# Patient Record
Sex: Male | Born: 1960 | Race: White | Hispanic: No | Marital: Single | Smoking: Never smoker
Health system: Southern US, Community
[De-identification: ages and names within clinical notes are randomized; demographics above are authoritative.]

## PROBLEM LIST (undated history)

## (undated) DIAGNOSIS — K219 Gastro-esophageal reflux disease without esophagitis: Secondary | ICD-10-CM

## (undated) HISTORY — PX: APPENDECTOMY: SHX54

---

## 2016-03-13 ENCOUNTER — Encounter: Payer: Self-pay | Admitting: *Deleted

## 2016-03-16 ENCOUNTER — Encounter: Payer: Self-pay | Admitting: Anesthesiology

## 2016-03-16 ENCOUNTER — Ambulatory Visit: Payer: BLUE CROSS/BLUE SHIELD | Admitting: Anesthesiology

## 2016-03-16 ENCOUNTER — Ambulatory Visit
Admission: RE | Admit: 2016-03-16 | Discharge: 2016-03-16 | Disposition: A | Discharge status: Home / Self Care | Payer: BLUE CROSS/BLUE SHIELD | Source: Ambulatory Visit | Attending: Gastroenterology | Admitting: Gastroenterology

## 2016-03-16 ENCOUNTER — Encounter
Admission: RE | Disposition: A | Discharge status: Home / Self Care | Payer: Self-pay | Source: Ambulatory Visit | Attending: Gastroenterology

## 2016-03-16 DIAGNOSIS — R1310 Dysphagia, unspecified: Secondary | ICD-10-CM | POA: Diagnosis not present

## 2016-03-16 DIAGNOSIS — Z1211 Encounter for screening for malignant neoplasm of colon: Secondary | ICD-10-CM | POA: Diagnosis not present

## 2016-03-16 DIAGNOSIS — A630 Anogenital (venereal) warts: Secondary | ICD-10-CM | POA: Insufficient documentation

## 2016-03-16 DIAGNOSIS — K219 Gastro-esophageal reflux disease without esophagitis: Secondary | ICD-10-CM | POA: Diagnosis not present

## 2016-03-16 HISTORY — PX: COLONOSCOPY WITH PROPOFOL: SHX5780

## 2016-03-16 HISTORY — PX: ESOPHAGOGASTRODUODENOSCOPY (EGD) WITH PROPOFOL: SHX5813

## 2016-03-16 HISTORY — DX: Gastro-esophageal reflux disease without esophagitis: K21.9

## 2016-03-16 SURGERY — COLONOSCOPY WITH PROPOFOL
Anesthesia: General

## 2016-03-16 MED ORDER — GLYCOPYRROLATE 0.2 MG/ML IJ SOLN
INTRAMUSCULAR | Status: DC | PRN
  Administered 2016-03-16: 0.2 mg via INTRAVENOUS

## 2016-03-16 MED ORDER — SODIUM CHLORIDE 0.9 % IV SOLN: INTRAVENOUS | Status: DC

## 2016-03-16 MED ORDER — PROPOFOL 500 MG/50ML IV EMUL
INTRAVENOUS | Status: DC | PRN
  Administered 2016-03-16: 200 ug/kg/min via INTRAVENOUS

## 2016-03-16 MED ORDER — LIDOCAINE HCL (CARDIAC) 20 MG/ML IV SOLN
INTRAVENOUS | Status: DC | PRN
  Administered 2016-03-16: 100 mg via INTRAVENOUS

## 2016-03-16 MED ORDER — SODIUM CHLORIDE 0.9 % IV SOLN
INTRAVENOUS | Status: DC
  Administered 2016-03-16: 1000 mL via INTRAVENOUS

## 2016-03-16 MED ORDER — PROPOFOL 10 MG/ML IV BOLUS
INTRAVENOUS | Status: DC | PRN
  Administered 2016-03-16: 70 mg via INTRAVENOUS

## 2016-03-16 NOTE — Transfer of Care (Signed)
Immediate Anesthesia Transfer of Care Note  Patient: Donald HeathJohn Strickland  Procedure(s) Performed: Procedure(s): COLONOSCOPY WITH PROPOFOL (N/A) ESOPHAGOGASTRODUODENOSCOPY (EGD) WITH PROPOFOL (N/A)  Patient Location: Endoscopy Unit  Anesthesia Type:General  Level of Consciousness: sedated  Airway & Oxygen Therapy: Patient connected to nasal cannula oxygen  Post-op Assessment: Post -op Vital signs reviewed and stable  Post vital signs: stable  Last Vitals:  Filed Vitals:   03/16/16 1248 03/16/16 1343  BP: 130/82 105/61  Pulse: 82 63  Temp: 37.4 C 36.2 C  Resp: 16 14    Last Pain: There were no vitals filed for this visit.       Complications: No apparent anesthesia complications

## 2016-03-16 NOTE — Op Note (Signed)
[facility] Gastroenterology Patient Name: Donald Strickland Procedure Date: 03/16/2016 12:16 PM MRN: [id number] Account #: [id number] Date of Birth: 04-Jun-1961 Admit Type: Outpatient Age: 55 Room: [facility] ENDO ROOM 4 Gender: Male Note Status: Finalized Procedure:            Upper GI endoscopy Indications:          Dysphagia Providers:            Ezzard Standing. Bluford Kaufmann, MD Referring MD:         Pollyann Samples. Mancheno Revelo (Referring MD) Medicines:            Monitored Anesthesia Care Complications:        No immediate complications. Procedure:            Pre-Anesthesia Assessment:                       - Prior to the procedure, a History and Physical was                        performed, and patient medications, allergies and                        sensitivities were reviewed. The patient's tolerance of                        previous anesthesia was reviewed.                       - The risks and benefits of the procedure and the                        sedation options and risks were discussed with the                        patient. All questions were answered and informed                        consent was obtained.                       - After reviewing the risks and benefits, the patient                        was deemed in satisfactory condition to undergo the                        procedure.                       After obtaining informed consent, the endoscope was                        passed under direct vision. Throughout the procedure,                        the patient's blood pressure, pulse, and oxygen                        saturations were monitored continuously. The Endoscope  was introduced through the mouth, and advanced to the                        second part of duodenum. The upper GI endoscopy was                        accomplished without difficulty. The patient tolerated                        the procedure well. Findings:      No  endoscopic abnormality was evident in the esophagus to explain the       patient's complaint of dysphagia. It was decided, however, to proceed       with dilation of the entire esophagus. The scope was withdrawn. Dilation       was performed with a Maloney dilator with mild resistance at 54 Fr.      The entire examined stomach was normal.      The examined duodenum was normal. Impression:           - No endoscopic esophageal abnormality to explain                        patient's dysphagia. Esophagus dilated. Dilated.                       - Normal stomach.                       - Normal examined duodenum.                       - No specimens collected. Recommendation:       - Discharge patient to home.                       - Observe patient's clinical course. Procedure Code(s):    --- Professional ---                       [phone], Esophagogastroduodenoscopy, flexible, transoral;                        diagnostic, including collection of specimen(s) by                        brushing or washing, when performed (separate procedure)                       43450, Dilation of esophagus, by unguided sound or                        bougie, single or multiple passes Diagnosis Code(s):    --- Professional ---                       R13.10, Dysphagia, unspecified CPT copyright 2016 American Medical Association. All rights reserved. The codes documented in this report are preliminary and upon coder review may  be revised to meet current compliance requirements. Wallace CullensPaul Y Daylin Eads, MD 03/16/2016 1:21:57 PM This report has been signed electronically. Number of Addenda: 0 Note Initiated On: 03/16/2016 12:16 PM      [facility]

## 2016-03-16 NOTE — Anesthesia Postprocedure Evaluation (Signed)
Anesthesia Post Note  Patient: Wardell HeathJohn Katayama  Procedure(s) Performed: Procedure(s) (LRB): COLONOSCOPY WITH PROPOFOL (N/A) ESOPHAGOGASTRODUODENOSCOPY (EGD) WITH PROPOFOL (N/A)  Patient location during evaluation: Endoscopy Anesthesia Type: General Level of consciousness: awake and alert Pain management: pain level controlled Vital Signs Assessment: post-procedure vital signs reviewed and stable Respiratory status: spontaneous breathing, nonlabored ventilation, respiratory function stable and patient connected to nasal cannula oxygen Cardiovascular status: blood pressure returned to baseline and stable Postop Assessment: no signs of nausea or vomiting Anesthetic complications: no    Last Vitals:  Filed Vitals:   03/16/16 1410 03/16/16 1420  BP: 125/69 100/84  Pulse: 74 70  Temp:    Resp: 16 13    Last Pain: There were no vitals filed for this visit.               Cleda MccreedyJoseph K Cove Haydon

## 2016-03-16 NOTE — Anesthesia Preprocedure Evaluation (Signed)
Anesthesia Evaluation  Patient identified by MRN, date of birth, ID band Patient awake    Reviewed: Allergy & Precautions, H&P , NPO status , Patient's Chart, lab work & pertinent test results  History of Anesthesia Complications Negative for: history of anesthetic complications  Airway Mallampati: II  TM Distance: >3 FB Neck ROM: full    Dental  (+) Teeth Intact   Pulmonary neg pulmonary ROS, neg shortness of breath,    Pulmonary exam normal breath sounds clear to auscultation       Cardiovascular Exercise Tolerance: Good (-) angina(-) Past MI and (-) DOE negative cardio ROS Normal cardiovascular exam Rhythm:regular Rate:Normal     Neuro/Psych negative neurological ROS  negative psych ROS   GI/Hepatic Neg liver ROS, GERD  Controlled,  Endo/Other  negative endocrine ROS  Renal/GU negative Renal ROS  negative genitourinary   Musculoskeletal   Abdominal   Peds  Hematology negative hematology ROS (+)   Anesthesia Other Findings Past Medical History:   GERD (gastroesophageal reflux disease)                      Past Surgical History:   APPENDECTOMY                                                 BMI    Body Mass Index   25.99 kg/m 2    Signs and symptoms suggestive of sleep apnea    Reproductive/Obstetrics negative OB ROS                             Anesthesia Physical Anesthesia Plan  ASA: III  Anesthesia Plan: General   Post-op Pain Management:    Induction:   Airway Management Planned:   Additional Equipment:   Intra-op Plan:   Post-operative Plan:   Informed Consent: I have reviewed the patients History and Physical, chart, labs and discussed the procedure including the risks, benefits and alternatives for the proposed anesthesia with the patient or authorized representative who has indicated his/her understanding and acceptance.   Dental Advisory Given  Plan  Discussed with: Anesthesiologist, CRNA and Surgeon  Anesthesia Plan Comments:         Anesthesia Quick Evaluation

## 2016-03-16 NOTE — H&P (Signed)
    Primary Care Physician:  Presley RaddleAdrian Mancheno, MD Primary Gastroenterologist:  Dr. Bluford Kaufmannh  Pre-Procedure History & Physical: HPI:  Donald Strickland is a 55 y.o. male is here for an EGD/colonoscopy.   Past Medical History  Diagnosis Date  . GERD (gastroesophageal reflux disease)     Past Surgical History  Procedure Laterality Date  . Appendectomy      Prior to Admission medications   Medication Sig Start Date End Date Taking? Authorizing Provider  emtricitabine-tenofovir (TRUVADA) 200-300 MG tablet Take 1 tablet by mouth daily.   Yes Historical Provider, MD  sildenafil (VIAGRA) 50 MG tablet Take 50 mg by mouth daily as needed for erectile dysfunction.   Yes Historical Provider, MD    Allergies as of 03/05/2016  . (Not on File)    History reviewed. No pertinent family history.  Social History   Social History  . Marital Status: Single    Spouse Name: N/A  . Number of Children: N/A  . Years of Education: N/A   Occupational History  . Not on file.   Social History Main Topics  . Smoking status: Never Smoker   . Smokeless tobacco: Never Used  . Alcohol Use: Not on file  . Drug Use: Not on file  . Sexual Activity: Not on file   Other Topics Concern  . Not on file   Social History Narrative    Review of Systems: See HPI, otherwise negative ROS  Physical Exam: BP 130/82 mmHg  Pulse 82  Temp(Src) 99.4 F (37.4 C) (Tympanic)  Resp 16  Ht 5' 11.5" (1.816 m)  Wt 189 lb (85.73 kg)  BMI 26.00 kg/m2  SpO2 100% General:   Alert,  pleasant and cooperative in NAD Head:  Normocephalic and atraumatic. Neck:  Supple; no masses or thyromegaly. Lungs:  Clear throughout to auscultation.    Heart:  Regular rate and rhythm. Abdomen:  Soft, nontender and nondistended. Normal bowel sounds, without guarding, and without rebound.   Neurologic:  Alert and  oriented x4;  grossly normal neurologically.  Impression/Plan: Donald Strickland is here for an EGD/colonoscopy to be performed for  dysphagia, screening.  Risks, benefits, limitations, and alternatives regarding  EGD/colonoscopy have been reviewed with the patient.  Questions have been answered.  All parties agreeable.   Emileo Semel, Ezzard StandingPAUL Y, MD  03/16/2016, 12:51 PM

## 2016-03-16 NOTE — Op Note (Signed)
[facility] Gastroenterology Patient Name: Donald Strickland Procedure Date: 03/16/2016 12:16 PM MRN: [id number] Account #: [id number] Date of Birth: [birth year] Admit Type: Outpatient Age: [age] [facility] ROOM 4 Gender: Male Note Status: Finalized Procedure:            Colonoscopy Indications:          Screening for colorectal malignant neoplasm Providers:            Ezzard StandingPaul Y. Bluford Kaufmannh, MD Referring MD:         Pollyann SamplesAdrian A. Mancheno Revelo (Referring MD) Medicines:            Monitored Anesthesia Care Complications:        No immediate complications. Procedure:            Pre-Anesthesia Assessment:                       - Prior to the procedure, a History and Physical was                        performed, and patient medications, allergies and                        sensitivities were reviewed. The patient's tolerance of                        previous anesthesia was reviewed.                       - The risks and benefits of the procedure and the                        sedation options and risks were discussed with the                        patient. All questions were answered and informed                        consent was obtained.                       - After reviewing the risks and benefits, the patient                        was deemed in satisfactory condition to undergo the                        procedure.                       After obtaining informed consent, the colonoscope was                        passed under direct vision. Throughout the procedure,                        the patient's blood pressure, pulse, and oxygen                        saturations were monitored continuously. The  Colonoscope was introduced through the anus and                        advanced to the the cecum, identified by appendiceal                        orifice and ileocecal valve. The colonoscopy was                        performed without difficulty. The  patient tolerated the                        procedure well. The quality of the bowel preparation                        was good. Findings:      The colon (entire examined portion) appeared normal. Small anal lesion.       Biopsy taken Impression:           - The entire examined colon is normal.                       - No specimens collected. Recommendation:       - Discharge patient to home.                       - Await pathology results.                       - Repeat colonoscopy in 10 years for surveillance.                       - The findings and recommendations were discussed with                        the patient. Procedure Code(s):    --- Professional ---                       [phone], Colonoscopy, flexible; diagnostic, including                        collection of specimen(s) by brushing or washing, when                        performed (separate procedure) Diagnosis Code(s):    --- Professional ---                       Z12.11, Encounter for screening for malignant neoplasm                        of colon CPT copyright 2016 American Medical Association. All rights reserved. The codes documented in this report are preliminary and upon coder review may  be revised to meet current compliance requirements. Wallace Cullens, MD 03/16/2016 1:38:01 PM This report has been signed electronically. Number of Addenda: 0 Note Initiated On: 03/16/2016 12:16 PM Scope Withdrawal Time: 0 hours 5 minutes 24 seconds  Total Procedure Duration: 0 hours 9 minutes 2 seconds       [facility]

## 2016-03-17 ENCOUNTER — Encounter: Payer: Self-pay | Admitting: Gastroenterology

## 2016-03-17 LAB — SURGICAL PATHOLOGY

## 2016-04-10 ENCOUNTER — Encounter: Payer: Self-pay | Admitting: *Deleted

## 2016-04-22 ENCOUNTER — Ambulatory Visit: Payer: Self-pay | Admitting: General Surgery

## 2016-06-02 ENCOUNTER — Ambulatory Visit: Payer: BLUE CROSS/BLUE SHIELD | Admitting: General Surgery

## 2016-07-02 ENCOUNTER — Encounter: Payer: Self-pay | Admitting: *Deleted

## 2016-07-08 ENCOUNTER — Encounter: Payer: Self-pay | Admitting: General Surgery

## 2016-07-08 ENCOUNTER — Ambulatory Visit (INDEPENDENT_AMBULATORY_CARE_PROVIDER_SITE_OTHER): Payer: BLUE CROSS/BLUE SHIELD | Admitting: General Surgery

## 2016-07-08 VITALS — BP 122/78 | HR 72 | Resp 12 | Ht 70.0 in | Wt 186.0 lb

## 2016-07-08 DIAGNOSIS — A630 Anogenital (venereal) warts: Secondary | ICD-10-CM

## 2016-07-08 NOTE — Progress Notes (Signed)
Patient ID: Donald Strickland, male   DOB: 01/06/1961, 55 y.o.   MRN: [id number]  Chief Complaint  Patient presents with  . Other    anal lesion     HPI Donald Strickland is a 55 y.o. male here today fro a evaluation of a anal lesion . Patient had a  colonoscopy on 03/16/16. No Gi problems at this time. He states he is having some trouble swallowing. Water goes down fine, he states this has been going on for about a year. The patient underwent upper endoscopy with dilatation in May with some mild improvement in his symptoms. Incidental finding noted during colonoscopy was a small nodular area biopsy which showed a condyloma. HPI  Past Medical History:  Diagnosis Date  . GERD (gastroesophageal reflux disease)     Past Surgical History:  Procedure Laterality Date  . APPENDECTOMY    . COLONOSCOPY WITH PROPOFOL N/A 03/16/2016   Procedure: COLONOSCOPY WITH PROPOFOL;  Surgeon: Wallace Cullens, MD;  Location: [facility] ENDOSCOPY;  Service: Gastroenterology;  Laterality: N/A;  . ESOPHAGOGASTRODUODENOSCOPY (EGD) WITH PROPOFOL N/A 03/16/2016   Procedure: ESOPHAGOGASTRODUODENOSCOPY (EGD) WITH PROPOFOL;  Surgeon: Wallace Cullens, MD;  Location: [facility] ENDOSCOPY;  Service: Gastroenterology;  Laterality: N/A;    History reviewed. No pertinent family history.  Social History Social History  Substance Use Topics  . Smoking status: Current Every Day Smoker  . Smokeless tobacco: Never Used  . Alcohol use Yes    Allergies  Allergen Reactions  . Tetracyclines & Related     Current Outpatient Prescriptions  Medication Sig Dispense Refill  . emtricitabine-tenofovir (TRUVADA) 200-300 MG tablet Take 1 tablet by mouth daily.    . sildenafil (VIAGRA) 50 MG tablet Take 50 mg by mouth daily as needed for erectile dysfunction.     No current facility-administered medications for this visit.     Review of Systems Review of Systems  Constitutional: Negative.   Respiratory: Negative.   Cardiovascular: Negative.    Gastrointestinal: Negative.     Blood pressure 122/78, pulse 72, resp. rate 12, height 5\' 10"  (1.778 m), weight 186 lb (84.4 kg).  Physical Exam Physical Exam  Constitutional: He is oriented to person, place, and time. He appears well-developed and well-nourished.  Eyes: Conjunctivae are normal. No scleral icterus.  Neck: Neck supple.  Cardiovascular: Normal rate, regular rhythm and normal heart sounds.   Pulmonary/Chest: Effort normal and breath sounds normal.  Abdominal: Soft. Normal appearance and bowel sounds are normal. There is no hepatomegaly. There is no tenderness.  Genitourinary: Prostate normal. Rectal exam shows fissure.     Lymphadenopathy:    He has no cervical adenopathy.  Neurological: He is alert and oriented to person, place, and time.  Skin: Skin is warm and dry.    Data Reviewed Endoscopy reports, pathology report.  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   Assessment    Condyloma with low-grade dysplasia. No visible lesion on exam.    Plan    Patient participates in anal receptive intercourse. Ongoing trauma puts him at risk for recurrent and progressive disease. Barrier protection recommended.  Patient reports his HIV status is negative. This would certainly be important  Recommendation from infectious disease was for evaluation at [facility] regarding possible ration versus other avenues to minimize the risk of virus transmission. This information has been forwarded to the patient in my chart.      Follow  up in one year with Anoscopy in office.   This information has been scribed by Donald Strickland.    Earline MayotteByrnett, Shanta Hartner W 07/10/2016, 11:02 AM

## 2016-07-09 ENCOUNTER — Encounter: Payer: Self-pay | Admitting: General Surgery

## 2016-07-21 ENCOUNTER — Telehealth: Payer: Self-pay | Admitting: *Deleted

## 2016-07-21 NOTE — Telephone Encounter (Signed)
-----   Message from Earline MayotteJeffrey W Byrnett, MD sent at 07/21/2016  4:27 PM EDT ----- Patient ws sent an email re: ID f/u re: [email or web address].  Confirm he received. Thanks.

## 2016-07-22 NOTE — Telephone Encounter (Signed)
Patient called back to confirm he received e-mail. He states if he has any more questions he will call back.

## 2016-07-22 NOTE — Telephone Encounter (Signed)
Patient called back and he is going to check his MyChart message now

## 2017-07-01 ENCOUNTER — Ambulatory Visit (INDEPENDENT_AMBULATORY_CARE_PROVIDER_SITE_OTHER): Payer: BLUE CROSS/BLUE SHIELD | Admitting: General Surgery

## 2017-07-01 ENCOUNTER — Encounter: Payer: Self-pay | Admitting: General Surgery

## 2017-07-01 VITALS — BP 142/82 | HR 100 | Resp 12 | Ht 71.0 in | Wt 186.0 lb

## 2017-07-01 DIAGNOSIS — A630 Anogenital (venereal) warts: Secondary | ICD-10-CM | POA: Diagnosis not present

## 2017-07-01 DIAGNOSIS — R1314 Dysphagia, pharyngoesophageal phase: Secondary | ICD-10-CM | POA: Diagnosis not present

## 2017-07-01 NOTE — Patient Instructions (Addendum)
The patient is aware to call back for any questions or concerns.  The patient is scheduled for a Barium swallow at [facility] on 07/07/17 at 8:00 am. He is to arrive by 7:45 am and have nothing to eat or drink after midnight. The patient is aware of date, time, and instructions.

## 2017-07-01 NOTE — Progress Notes (Signed)
Patient ID: Donald Strickland, male   DOB: 1961-06-15, 56 y.o.   MRN: [id number]  Chief Complaint  Patient presents with  . Follow-up    anoscopy    HPI Donald Strickland is a 56 y.o. male.  Here for follow up. Denies any diarrhea or constipation. Bowels move daily. Last colonoscopy was 2017. He feels medications stick in upper throat. He was having this issue last year when he had the endoscopy. He has even tried putting pills in food to take and it didn't help. during colonoscopy was a small nodular area biopsy which showed a condyloma.   HPI  Past Medical History:  Diagnosis Date  . GERD (gastroesophageal reflux disease)     Past Surgical History:  Procedure Laterality Date  . APPENDECTOMY    . COLONOSCOPY WITH PROPOFOL N/A 03/16/2016   Procedure: COLONOSCOPY WITH PROPOFOL;  Surgeon: Wallace Cullens, MD;  Location: [facility] ENDOSCOPY;  Service: Gastroenterology;  Laterality: N/A;  . ESOPHAGOGASTRODUODENOSCOPY (EGD) WITH PROPOFOL N/A 03/16/2016   Procedure: ESOPHAGOGASTRODUODENOSCOPY (EGD) WITH PROPOFOL;  Surgeon: Wallace Cullens, MD;  Location: [facility] ENDOSCOPY;  Service: Gastroenterology;  Laterality: N/A;    History reviewed. No pertinent family history.  Social History Social History  Substance Use Topics  . Smoking status: Never Smoker  . Smokeless tobacco: Never Used  . Alcohol use Yes    Allergies  Allergen Reactions  . Tetracyclines & Related     Current Outpatient Prescriptions  Medication Sig Dispense Refill  . emtricitabine-tenofovir (TRUVADA) 200-300 MG tablet Take 1 tablet by mouth daily.    . sildenafil (VIAGRA) 50 MG tablet Take 50 mg by mouth daily as needed for erectile dysfunction.     No current facility-administered medications for this visit.     Review of Systems Review of Systems  Constitutional: Negative.   Respiratory: Negative.   Cardiovascular: Negative.   Gastrointestinal: Negative for blood in stool, constipation and diarrhea.    Blood pressure (!) 142/82,  pulse 100, resp. rate 12, height 5\' 11"  (1.803 m), weight 186 lb (84.4 kg).  Physical Exam Physical Exam  Constitutional: He is oriented to person, place, and time. He appears well-developed and well-nourished.  HENT:  Mouth/Throat: Oropharynx is clear and moist.  Eyes: Conjunctivae are normal. No scleral icterus.  Neck: Neck supple.  Cardiovascular: Normal rate, regular rhythm and normal heart sounds.   Pulmonary/Chest: Effort normal and breath sounds normal.  Abdominal: Soft. There is no tenderness.  Lymphadenopathy:    He has no cervical adenopathy.  Neurological: He is alert and oriented to person, place, and time.  Skin: Skin is warm and dry.  Psychiatric: His behavior is normal.    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   Anoscopy was completed. No visible abnormality of the lower rectal mucosa, and a rectal junction or perianal tissue.  Assessment    Past history condyloma with low-grade dysplasia in a patient with high-risk sexual habits.    Plan    Referral information regarding formal infectious disease consultation awake forced had previously been sent to the patient. At this time he is deciding whether to act on this or not.  Bartholomew Boards, M.D.  Assistant Professor, Infectious Disease  [facility]  Certified, Infectious Diseases, American Board of Internal Medicine  Department of Internal Medicine  [address] [place], [place] [address]  Phone: [phone]  Fax: [phone]       The upper endoscopy report from 03/16/2016 showed no evidence of a source for his reported dysphasia and the patient reported transient improvement after bougie dilatation. He reports no difficulty with eating bread or meat, mainly difficulty with pills. Someone had suggested the possibility that he could have a  ledge or diverticulum making pill swallowing difficult (which is a new problem over the last 2 years). It's possible he has a Zenker's diverticulum, but he is not certainly have the typical age group for this problem. He may have some other esophageal motility issue and this can be best assessed by a detailed swallow study.  Unless he has symptoms related to the anus a annual exam and anoscopy is been recommended.     Barium swallow Follow up in one year with office anoscopy.  HPI, Physical Exam, Assessment and Plan have been scribed under the direction and in the presence of Earline Mayotte, MD. Dorathy Daft, RN  I have completed the exam and reviewed the above documentation for accuracy and completeness.  I agree with the above.  Museum/gallery conservator has been used and any errors in dictation or transcription are unintentional.  Donnalee Curry, M.D., F.A.C.S.  The patient is scheduled for a Barium swallow at [facility] on 07/07/17 at 8:00 am. He is to arrive by 7:45 am and have nothing to eat or drink after midnight. The patient is aware of date, time, and instructions.  Documented by Sinda Du LPN  Earline Mayotte 07/02/2017, 7:49 AM

## 2017-07-02 DIAGNOSIS — R1314 Dysphagia, pharyngoesophageal phase: Secondary | ICD-10-CM | POA: Insufficient documentation

## 2017-07-07 ENCOUNTER — Ambulatory Visit
Admission: RE | Admit: 2017-07-07 | Discharge: 2017-07-07 | Disposition: A | Discharge status: Home / Self Care | Payer: BLUE CROSS/BLUE SHIELD | Source: Ambulatory Visit | Attending: General Surgery | Admitting: General Surgery

## 2017-07-07 DIAGNOSIS — R1314 Dysphagia, pharyngoesophageal phase: Secondary | ICD-10-CM | POA: Insufficient documentation

## 2017-07-08 ENCOUNTER — Telehealth: Payer: Self-pay

## 2017-07-08 NOTE — Telephone Encounter (Signed)
Unable to leave message to call back for results.

## 2017-07-08 NOTE — Telephone Encounter (Signed)
-----   Message from Jeffrey W Byrnett, MD sent at 07/07/2017  7:21 PM EDT ----- Patient sent a message in MyChart: Normal study. Confirm he was able to retrieve the message. Thanks.  ----- Message ----- From: Interface, Rad Results In Sent: 07/07/2017   8:47 AM To: Jeffrey W Byrnett, MD    

## 2017-07-09 NOTE — Telephone Encounter (Signed)
-----   Message from Earline MayotteJeffrey W Byrnett, MD sent at 07/07/2017  7:21 PM EDT ----- Patient sent a message in MyChart: Normal study. Confirm he was able to retrieve the message. Thanks.  ----- Message ----- From: Interface, Rad Results In Sent: 07/07/2017   8:47 AM To: Earline MayotteJeffrey W Byrnett, MD

## 2017-07-09 NOTE — Telephone Encounter (Signed)
Patient aware of results and had no questions.

## 2018-05-25 ENCOUNTER — Encounter: Payer: Self-pay | Admitting: *Deleted

## 2018-07-05 ENCOUNTER — Ambulatory Visit: Payer: BLUE CROSS/BLUE SHIELD | Admitting: General Surgery

## 2018-08-11 ENCOUNTER — Ambulatory Visit: Payer: BLUE CROSS/BLUE SHIELD | Admitting: General Surgery

## 2018-10-05 ENCOUNTER — Encounter: Payer: Self-pay | Admitting: *Deleted

## 2020-02-14 ENCOUNTER — Ambulatory Visit
Admission: EM | Admit: 2020-02-14 | Discharge: 2020-02-14 | Disposition: A | Discharge status: Home / Self Care | Payer: BLUE CROSS/BLUE SHIELD | Attending: Family Medicine | Admitting: Family Medicine

## 2020-02-14 ENCOUNTER — Encounter: Payer: Self-pay | Admitting: Emergency Medicine

## 2020-02-14 ENCOUNTER — Other Ambulatory Visit: Payer: Self-pay

## 2020-02-14 ENCOUNTER — Ambulatory Visit (INDEPENDENT_AMBULATORY_CARE_PROVIDER_SITE_OTHER): Payer: BLUE CROSS/BLUE SHIELD

## 2020-02-14 DIAGNOSIS — M25552 Pain in left hip: Secondary | ICD-10-CM

## 2020-02-14 DIAGNOSIS — W19XXXA Unspecified fall, initial encounter: Secondary | ICD-10-CM

## 2020-02-14 NOTE — ED Triage Notes (Signed)
Patient states he fell yesterday on his left side. He states this morning he woke up and his left hip is hurting and hurts more when he lays down.

## 2020-02-14 NOTE — ED Provider Notes (Signed)
[facility]    CSN: [id number] Arrival date & time: 02/14/20  1330  History   Chief Complaint Chief Complaint  Patient presents with  . Fall  . Hip Pain    left   HPI 59 year old male presents with the above complaints.  Patient states that he was hiking yesterday.  He ended up falling and landing on his left hip.  Patient reports that he seemed to do fine after the fall.  However, last night he developed left hip pain.  He reports lateral and medial thigh pain as well as groin pain.  He is able to ambulate.  He states that his pain is currently 1/10 in severity.  Described as an aching sensation.  No other reported symptoms.  No other complaints   Past Medical History:  Diagnosis Date  . GERD (gastroesophageal reflux disease)    Patient Active Problem List   Diagnosis Date Noted  . Pharyngoesophageal dysphagia 07/02/2017  . Condyloma acuminatum in male 03/16/2016   Past Surgical History:  Procedure Laterality Date  . APPENDECTOMY    . COLONOSCOPY WITH PROPOFOL N/A 03/16/2016   Procedure: COLONOSCOPY WITH PROPOFOL;  Surgeon: Wallace Cullens, MD;  Location: [facility] ENDOSCOPY;  Service: Gastroenterology;  Laterality: N/A;  . ESOPHAGOGASTRODUODENOSCOPY (EGD) WITH PROPOFOL N/A 03/16/2016   Procedure: ESOPHAGOGASTRODUODENOSCOPY (EGD) WITH PROPOFOL;  Surgeon: Wallace Cullens, MD;  Location: [facility] ENDOSCOPY;  Service: Gastroenterology;  Laterality: N/A;    Home Medications    Prior to Admission medications   Medication Sig Start Date End Date Taking? Authorizing Provider  emtricitabine-tenofovir (TRUVADA) 200-300 MG tablet Take 1 tablet by mouth daily.   Yes [provider]  sildenafil (VIAGRA) 50 MG tablet Take 50 mg by mouth daily as needed for erectile dysfunction.    [provider]   Social History Social History   Tobacco Use  . Smoking status: Never Smoker  . Smokeless tobacco: Never Used  Substance Use Topics  . Alcohol use: Yes  . Drug use: No      Allergies   Tetracyclines & related   Review of Systems Review of Systems  Musculoskeletal:       Left hip pain   Physical Exam Triage Vital Signs ED Triage Vitals  Enc Vitals Group     BP 02/14/20 1348 (!) 136/95     Pulse Rate 02/14/20 1348 87     Resp 02/14/20 1348 18     Temp 02/14/20 1348 98 F (36.7 C)     Temp Source 02/14/20 1348 Oral     SpO2 02/14/20 1348 98 %     Weight 02/14/20 1347 202 lb (91.6 kg)     Height 02/14/20 1347 5\' 11"  (1.803 m)     Head Circumference --      Peak Flow --      Pain Score 02/14/20 1347 1     Pain Loc --      Pain Edu? --      Excl. in GC? --    Updated Vital Signs BP (!) 136/95 (BP Location: Left Arm)   Pulse 87   Temp 98 F (36.7 C) (Oral)   Resp 18   Ht 5\' 11"  (1.803 m)   Wt 91.6 kg   SpO2 98%   BMI 28.17 kg/m   Visual Acuity Right Eye Distance:   Left Eye Distance:   Bilateral Distance:    Right Eye Near:   Left Eye Near:    Bilateral Near:  Physical Exam Vitals and nursing note reviewed.  Constitutional:      General: He is not in acute distress.    Appearance: Normal appearance. He is not ill-appearing.  HENT:     Head: Normocephalic and atraumatic.  Eyes:     General:        Right eye: No discharge.        Left eye: No discharge.     Conjunctiva/sclera: Conjunctivae normal.  Cardiovascular:     Rate and Rhythm: Normal rate and regular rhythm.     Heart sounds: No murmur.  Pulmonary:     Effort: Pulmonary effort is normal. No respiratory distress.  Musculoskeletal:     Comments: Left hip -good range of motion of the hip.  Mild pain with FADIR. No bruising noted. No greater trochanter tenderness.   Neurological:     Mental Status: He is alert.  Psychiatric:        Mood and Affect: Mood normal.        Behavior: Behavior normal.    UC Treatments / Results  Labs (all labs ordered are listed, but only abnormal results are displayed) Labs Reviewed - No data to  display  EKG   Radiology DG Hip Unilat W or Wo Pelvis 2-3 Views Left  Result Date: 02/14/2020 CLINICAL DATA:  Left hip pain. The patient fell climbing out of a creek yesterday. Initial encounter. EXAM: DG HIP (WITH OR WITHOUT PELVIS) 2-3V LEFT COMPARISON:  None. FINDINGS: There is no evidence of hip fracture or dislocation. There is no evidence of arthropathy or other focal bone abnormality. IMPRESSION: Normal exam. Electronically Signed   By: Inge Rise M.D.   On: 02/14/2020 14:33    Procedures Procedures (including critical care time)  Medications Ordered in UC Medications - No data to display  Initial Impression / Assessment and Plan / UC Course  I have reviewed the triage vital signs and the nursing notes.  Pertinent labs & imaging results that were available during my care of the patient were reviewed by me and considered in my medical decision making (see chart for details).    59 year old male presents with hip pain after suffering a fall.  X-ray negative.  Tylenol and ibuprofen as needed.  Supportive care.   Final Clinical Impressions(s) / UC Diagnoses   Final diagnoses:  Hip pain, acute, left  Fall, initial encounter    ED Prescriptions    None     PDMP not reviewed this encounter.   Coral Spikes, [place] 02/14/20 1506

## 2020-02-14 NOTE — Discharge Instructions (Addendum)
It was very nice seeing you today in clinic. Thank you for entrusting me with your care.   The xray of your hip was NEGATIVE for acute fracture or dislocation. Will pursue conservative treatment at home with rest and heat/ice. Heat/ice should be applied 3-4 times a day for 15-20 minutes at a time. May use Tylenol and/or Ibuprofen as needed for discomfort.   Make arrangements to follow up with your regular doctor in 1 week for re-evaluation if not improving. If your symptoms/condition worsens, please seek follow up care either here or in the ER. Please remember, our [facility] providers are "right here with you" when you need [place].   Again, it was my pleasure to take care of you today. Thank you for choosing our clinic. I hope that you start to feel better quickly.   Quentin Mulling, MSN, APRN, FNP-C, CEN Advanced Practice Provider [facility] 02/14/2020 2:48 PM

## 2021-05-22 IMAGING — CR DG HIP (WITH OR WITHOUT PELVIS) 2-3V*L*
3 series · 3 of 3 positions shown · non-contrast
Comparison: None.

CLINICAL DATA: Left hip pain. The patient fell climbing out of [place] yesterday. Initial encounter.

EXAM:
DG HIP (WITH OR WITHOUT PELVIS) 2-3V LEFT

[pelvis ap]
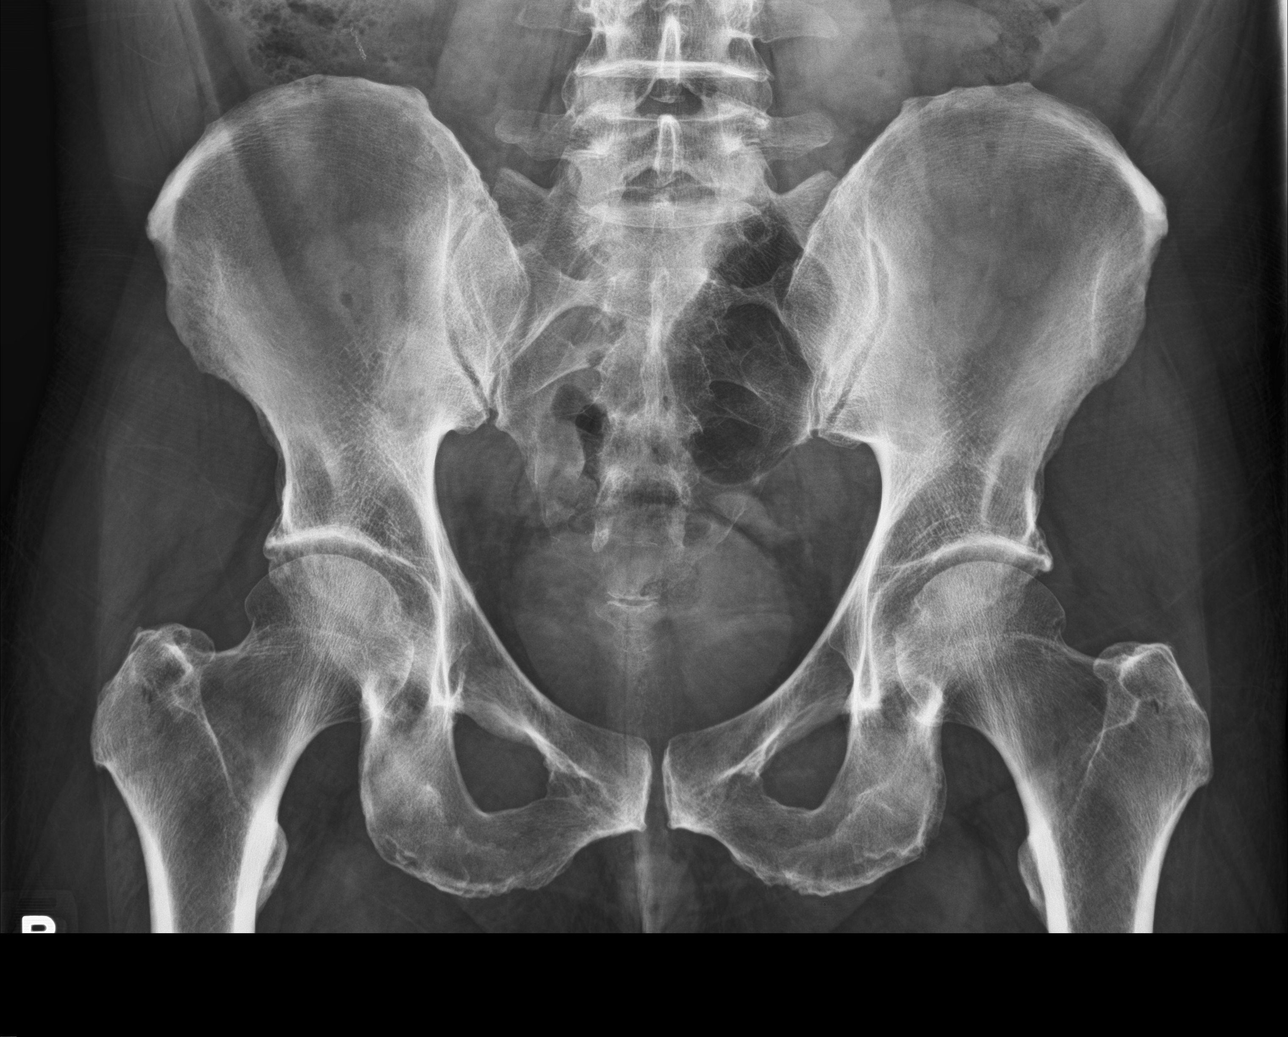

[hip ap]
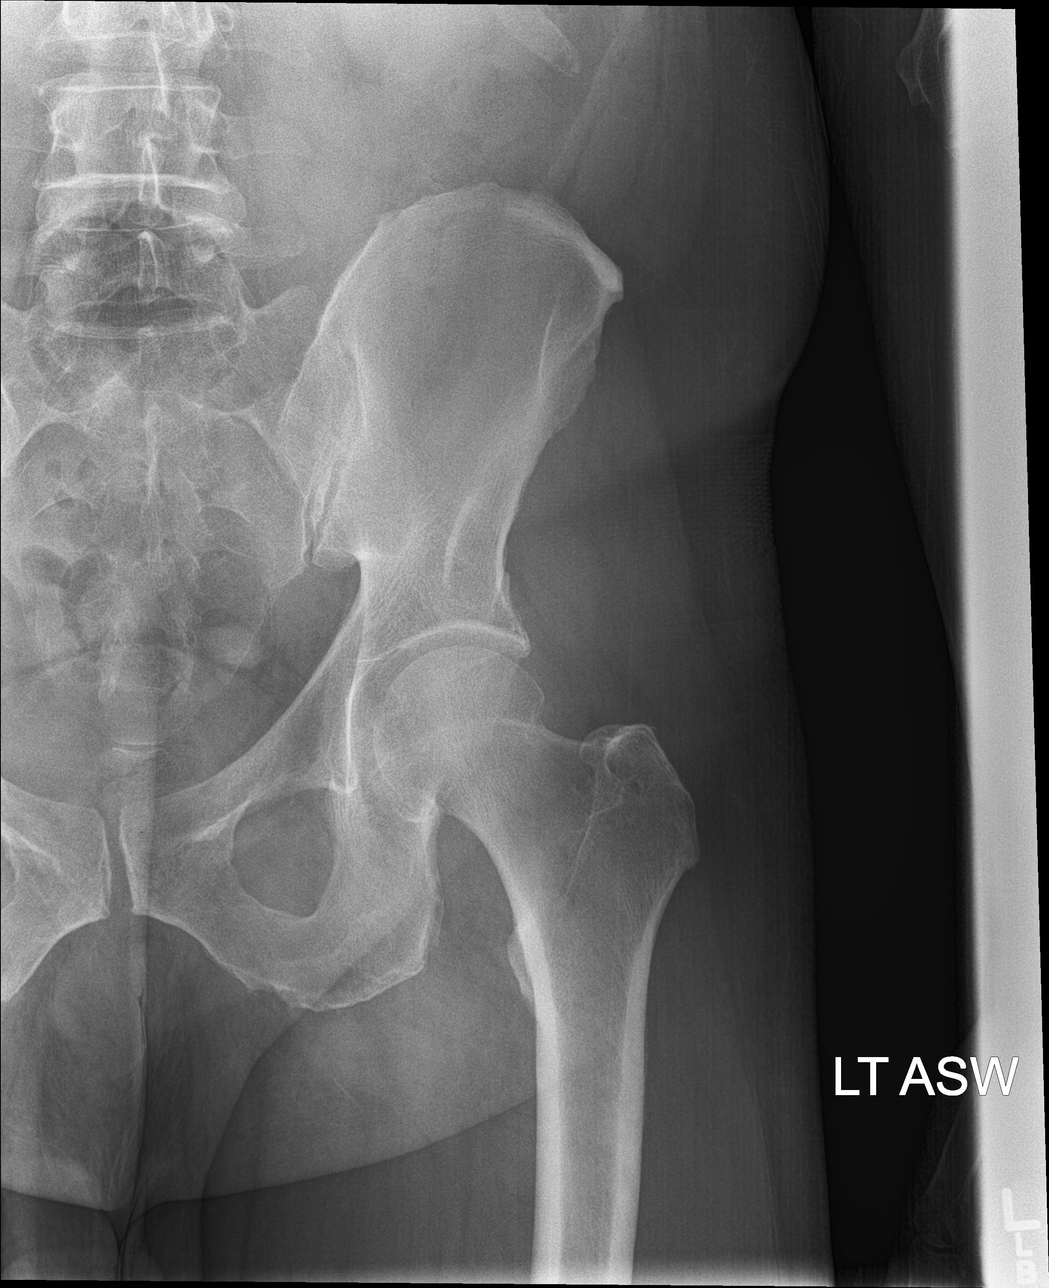

[hip lat]
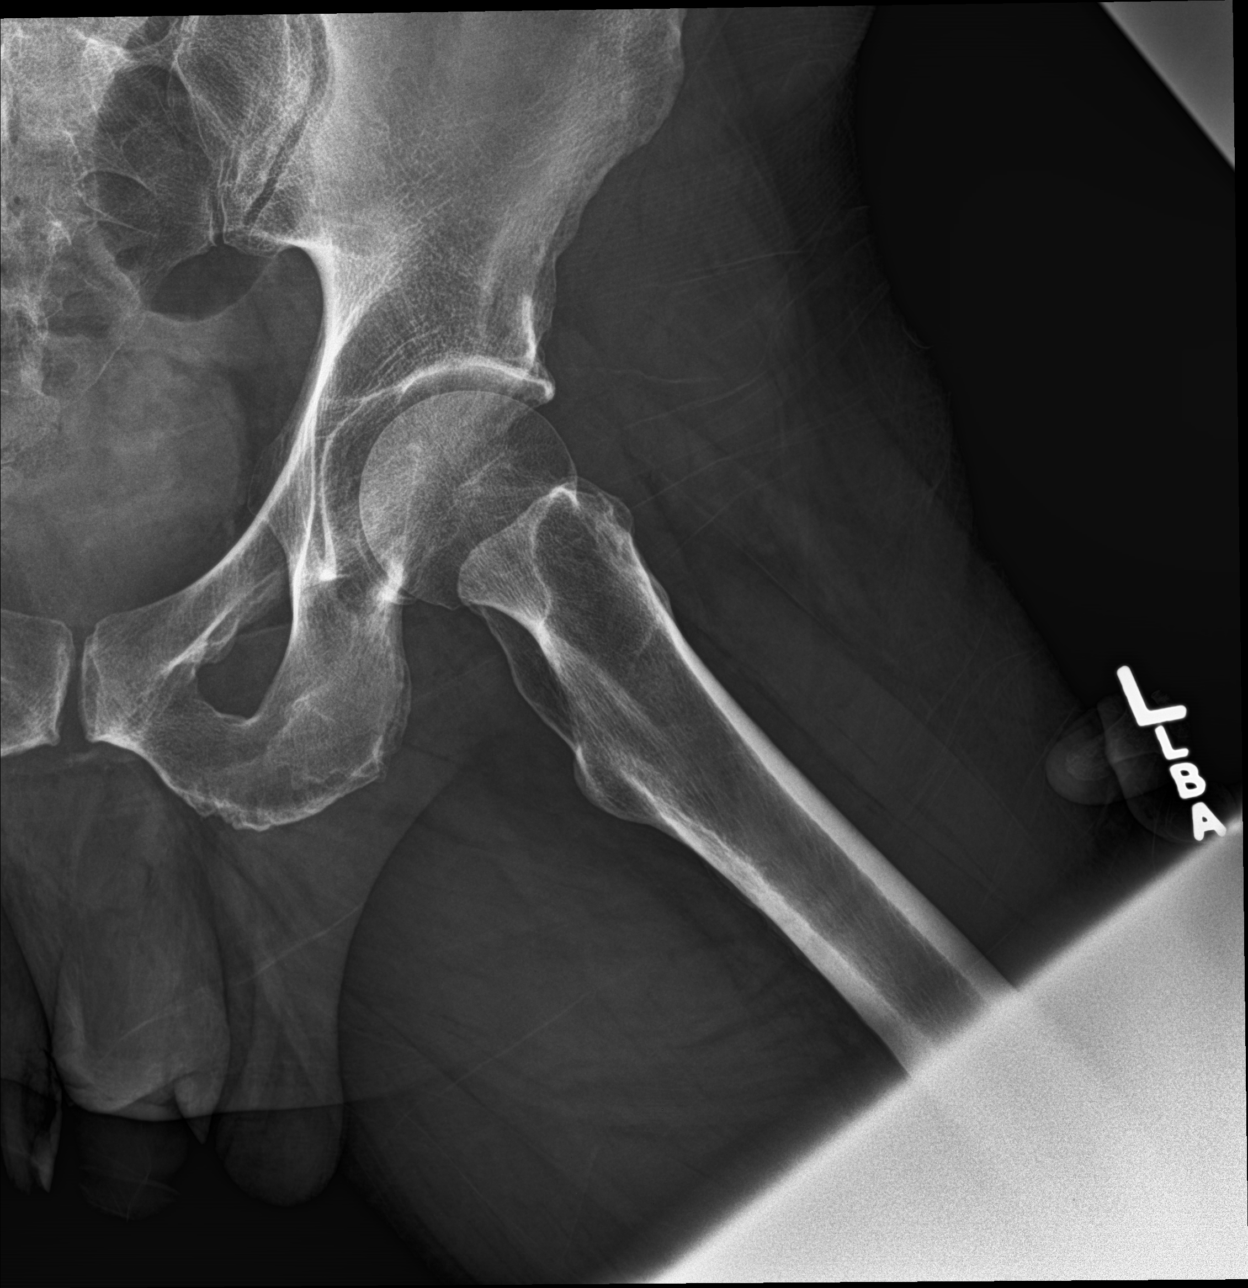

[3 of 3 positions shown; findings below may reference images not displayed]

FINDINGS: There is no evidence of hip fracture or dislocation. There is no
evidence of arthropathy or other focal bone abnormality.
IMPRESSION: Normal exam.

## 2021-07-29 ENCOUNTER — Ambulatory Visit: Payer: Self-pay | Admitting: Physician Assistant

## 2021-07-29 ENCOUNTER — Other Ambulatory Visit: Payer: Self-pay

## 2021-07-29 DIAGNOSIS — Z113 Encounter for screening for infections with a predominantly sexual mode of transmission: Secondary | ICD-10-CM

## 2021-07-29 DIAGNOSIS — Z202 Contact with and (suspected) exposure to infections with a predominantly sexual mode of transmission: Secondary | ICD-10-CM

## 2021-07-29 LAB — HEPATITIS B SURFACE ANTIGEN

## 2021-07-29 LAB — HM HEPATITIS C SCREENING LAB: HM Hepatitis Screen: NEGATIVE

## 2021-07-29 LAB — HM HIV SCREENING LAB: HM HIV Screening: NEGATIVE

## 2021-07-29 MED ORDER — CEFTRIAXONE SODIUM 500 MG IJ SOLR
500.0000 mg | Freq: Once | INTRAMUSCULAR | Status: AC
  Administered 2021-07-29: 500 mg via INTRAMUSCULAR

## 2021-07-29 MED ORDER — AZITHROMYCIN 500 MG PO TABS
1000.0000 mg | ORAL_TABLET | Freq: Once | ORAL | Status: AC
Start: 2021-07-29 — End: 2021-07-29
  Administered 2021-07-29: 1000 mg via ORAL

## 2021-07-29 NOTE — Progress Notes (Signed)
Pt here for STD screening.  Ceftriaxone 500 mg given IM without any complications. Medication dispensed per Provider orders.  Pt declined condoms. Berdie Ogren, RN

## 2021-07-30 ENCOUNTER — Encounter: Payer: Self-pay | Admitting: Physician Assistant

## 2021-07-30 NOTE — Progress Notes (Signed)
[facility] Department STI clinic/screening visit  Subjective:  Donald Strickland is a 60 y.o. male being seen today for an STI screening visit. The patient reports they do not have symptoms.    Patient has the following medical conditions:   Patient Active Problem List   Diagnosis Date Noted   Pharyngoesophageal dysphagia 07/02/2017   Condyloma acuminatum in male 03/16/2016     Chief Complaint  Patient presents with   SEXUALLY TRANSMITTED DISEASE    Screening.     HPI  Patient reports that he is not having any symptoms but is a contact to [facility].  Patient states that he is taking Descovy as prescribed.  States that he is not sure whether he has been tested for Hep B and C and requests testing today.  States last HIV test was 1-2 months ago and last void prior to sample collection for GC/Chlamydia testing was about 2 hr ago.   Screening for MPX risk: Does the patient have an unexplained rash? No Is the patient MSM? Yes Does the patient endorse multiple sex partners or anonymous sex partners? Yes Did the patient have close or sexual contact with a person diagnosed with MPX? No Has the patient traveled outside the [place] where MPX is endemic? No Is there a high clinical suspicion for MPX-- evidenced by one of the following No  -Unlikely to be chickenpox  -Lymphadenopathy  -Rash that present in same phase of evolution on any given body part   See flowsheet for further details and programmatic requirements.    The following portions of the patient's history were reviewed and updated as appropriate: allergies, current medications, past medical history, past social history, past surgical history and problem list.  Objective:  There were no vitals filed for this visit.  Physical Exam Constitutional:      General: He is not in acute distress.    Appearance: Normal appearance.  HENT:     Head: Normocephalic and atraumatic.     Comments: No nits,lice, or hair loss. No  cervical, supraclavicular or axillary adenopathy.     Mouth/Throat:     Mouth: Mucous membranes are moist.     Pharynx: Oropharynx is clear. No oropharyngeal exudate or posterior oropharyngeal erythema.  Eyes:     Conjunctiva/sclera: Conjunctivae normal.  Pulmonary:     Effort: Pulmonary effort is normal.  Abdominal:     Palpations: Abdomen is soft. There is no mass.     Tenderness: There is no abdominal tenderness. There is no guarding or rebound.  Musculoskeletal:     Cervical back: Neck supple. No tenderness.  Skin:    General: Skin is warm and dry.     Findings: No bruising, erythema, lesion or rash.  Neurological:     Mental Status: He is alert and oriented to person, place, and time.  Psychiatric:        Mood and Affect: Mood normal.        Behavior: Behavior normal.        Thought Content: Thought content normal.        Judgment: Judgment normal.      Assessment and Plan:  Donald Strickland is a 60 y.o. male presenting to the [facility] Department for STI screening  1. Screening for STD (sexually transmitted disease) Patient into clinic without symptoms. Rec condoms with all sex. Await test results.  Counseled that RN will call if needs to RTC for treatment once results are back.  -  Chlamydia/Gonorrhea [facility] - HBV Antigen/Antibody [facility] - HIV/HCV [facility] - Syphilis Serology, [facility] - Chlamydia/Gonorrhea [facility]  2. Gonorrhea contact Will treat as a contact to [facility] and cover for Chlamydia with Ceftriaxone 500 mg IM and Azithromycin 1 g po DOT today. No sex for 14 days and until after partner completes treatment. Call with questions or concerns. - cefTRIAXone (ROCEPHIN) injection 500 mg - azithromycin (ZITHROMAX) tablet 1,000 mg     No follow-ups on file.  No future appointments.  Matt Holmes, PA

## 2024-09-11 ENCOUNTER — Other Ambulatory Visit: Payer: Self-pay | Admitting: Nurse Practitioner

## 2024-09-11 DIAGNOSIS — N6310 Unspecified lump in the right breast, unspecified quadrant: Secondary | ICD-10-CM

## 2024-10-03 ENCOUNTER — Ambulatory Visit
Admission: RE | Admit: 2024-10-03 | Discharge: 2024-10-03 | Disposition: A | Discharge status: Home / Self Care | Source: Ambulatory Visit | Attending: Nurse Practitioner | Admitting: Nurse Practitioner

## 2024-10-03 DIAGNOSIS — N6310 Unspecified lump in the right breast, unspecified quadrant: Secondary | ICD-10-CM | POA: Diagnosis present

## 2024-10-03 DIAGNOSIS — N62 Hypertrophy of breast: Secondary | ICD-10-CM | POA: Insufficient documentation

## 2024-10-03 DIAGNOSIS — N644 Mastodynia: Secondary | ICD-10-CM | POA: Insufficient documentation
